# Patient Record
Sex: Male | Born: 2005 | Hispanic: Yes | Marital: Single | Smoking: Never smoker
Health system: Southern US, Community
[De-identification: ages and names within clinical notes are randomized; demographics above are authoritative.]

---
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
## 2023-04-19 ENCOUNTER — Ambulatory Visit (HOSPITAL_COMMUNITY)
Admission: EM | Admit: 2023-04-19 | Discharge: 2023-04-19 | Disposition: A | Discharge status: Home / Self Care | Attending: Emergency Medicine | Admitting: Emergency Medicine

## 2023-04-19 ENCOUNTER — Encounter (HOSPITAL_COMMUNITY): Payer: Self-pay | Admitting: Emergency Medicine

## 2023-04-19 ENCOUNTER — Other Ambulatory Visit: Payer: Self-pay

## 2023-04-19 DIAGNOSIS — U071 COVID-19: Secondary | ICD-10-CM | POA: Diagnosis not present

## 2023-04-19 LAB — POC COVID19/FLU A&B COMBO
Covid Antigen, POC: POSITIVE — AB
Influenza A Antigen, POC: NEGATIVE
Influenza B Antigen, POC: NEGATIVE

## 2023-04-19 MED ORDER — PROMETHAZINE-DM 6.25-15 MG/5ML PO SYRP: 5.0000 mL | ORAL_SOLUTION | Freq: Four times a day (QID) | ORAL | 0 refills | Status: AC | PRN

## 2023-04-19 NOTE — ED Triage Notes (Signed)
 Pt having fever, fatigue and cough for the past 3 days, no taking medication for fever.

## 2023-04-19 NOTE — Discharge Instructions (Signed)
 What is COVID-19?   ??V?D-19 stands for "coronavirus disease 2019." It is caused by a virus called ??RS-?oV-2.   C?V?D-19 mainly spreads from person to person usually when an infected person coughs, sneezes, or talks near other people.  A person can become infected and spread the virus to others, even without having any symptoms.  This one of the reasons COVID-19 spreads so quickly.   Can COVID-19 be prevented?   Yes!  The best way to prevent ??V?D-19 is to get vaccinated. In the [place], people age 23 months and older can get a vaccine.  In addition to vaccination, there are other things you can do to help protect yourself and others. You can:  ?Wash your hands often. ?Consider wearing a face mask in some situations. Masks can help protect both the wearer and others around them. ?Stay home when you are sick. Try to avoid close contact with other people. ?Cover your mouth and nose with the inside of your elbow when you cough or sneeze. ?If someone in your home is sick, regularly clean things that are touched a lot. This includes counters, bedside tables, doorknobs, computers, phones, and bathroom surfaces. ?Make sure that there is good ventilation (air flow) in your home. When possible, open windows to let fresh air in.  Experts recommend "layering" these strategies. This means doing more than 1 of the things above to protect yourself, especially at times when lots of people are sick.  What are the symptoms of COVID-19?   Symptoms usually start 3 to 5 days after a person is infected with the virus but for some people, it can take up to 2 weeks for symptoms to appear. Many infected people only have mild cold symptoms. Some people never show symptoms at all.  When symptoms do happen, they can include:  ? Fever ? Cough ? Trouble breathing ? Feeling tired ? Shaking chills ? Muscle aches ? Headache ? Sore throat ? Runny or stuffy nose ? Problems with sense of smell or taste ? Diarrhea and  vomiting ? Rash or other skin problems  For most people, symptoms get better within a few days to weeks. But a small number of people get very sick and stop being able to breathe on their own. In severe cases, their organs stop working, which can lead to death.  Some people with C?VID-19 continue to have some symptoms for weeks or months. This seems to be more likely in people who are sick enough to need to stay in the hospital. But this can also happen in people who did not get very sick.  What should I do if I get COVID-19?   Stay home, rest, and drink plenty of fluids. You can also take acetaminophen (sample brand name: Tylenol) to relieve fever and aches. If this does not help, you can try medicines like ibuprofen (sample brand names: Advil, Motrin).  If you go to a walk-in clinic or a hospital because of your symptoms, tell someone right away why you are there. The staff might ask you to wear a mask or to wait someplace where you are less likely to spread your infection.  Whether or not you need to see a medical provider, stay home while you are sick with C?V?D-19. Do not go to work or school until your fever has been gone for at least 24 hours without taking medicine such as acetaminophen.  If your breathing symptoms get worse, call your doctor or nurse for advice. If you think  that you are having a medical emergency, call 911 for an ambulance  If I have COVID-19, do I need special treatment?   It depends on your age, health, and symptoms. Most people with mild C?VID-19 can rest at home until they get better. "Mild" means that you might have symptoms like fever, cough, or other cold symptoms, but you do not have trouble breathing. It often takes about 2 weeks for symptoms to improve, but it's not the same for everyone.  Doctors do recommend treatment for people who are at risk for getting seriously ill, even if their symptoms are mild. This includes:  ?Adults 65 years or older ?Adults  who have certain health conditions - Examples include a weaker than normal immune system, diabetes, serious heart or lung disease, chronic kidney disease, and obesity. ?Adults 50 years or older who have not been vaccinated  If you are not sure if you fit into any of these categories, ask your doctor or nurse about treatment. They can talk to you about the risks and benefits.  How is COVID-19 treated?   Conservative care is recommended with rest, staying at home, drinking plenty of clear fluids and taking medications to treat your symptoms.  Am I at risk for getting seriously ill?   It depends on your age, your health, and whether you have been vaccinated. In some people, ??V?D-19 leads to serious problems like ???um?ni?, which can cause a person to not get enough oxygen. It can also lead to heart problems, or even death. This risk gets higher as people get older. It is also higher in people who have other health problems like serious heart disease, chronic kidney disease, type 2 diabetes, chronic obstructive pulmonary disease ("COPD"), sickle cell disease, or obesity. People who have a weak immune system for other reasons (for example, HIV infection or certain medicines), asthma, cystic fibrosis, type 1 diabetes, or high blood pressure might also be at higher risk for serious problems.  Getting vaccinated makes people much less likely to get seriously ill with ??V?D-19.  When should I seek medical attention?   Call your doctor if:  ?You develop new shortness of breath, or your breathing gets worse (but you can still talk in full sentences). ?You become weak or dizzy. ?You have very dark urine or do not urinate for more than 8 hours. ?You have new or worsening symptoms that concern you - ??VID-19 symptoms can include fever, cough, feeling very tired, shaking, chills, headache, and trouble swallowing. They can also include digestive problems like vomiting or diarrhea.  Call 911 for an ambulance  if:  ?You are having so much trouble breathing that you cannot speak a full sentence. ?You are very confused or cannot stay awake. ?Your lips or skin start to turn blue. ?You think that you might be having a medical emergency - Examples include severe chest pain, feeling extremely weak or like you might pass out, or losing control of your body (like being unable to speak normally or move your arm or leg).  Where can I go to learn more?   You can find more information about ?OVID-19 at the following websites: ?[place]  Centers for Disease Control and Prevention ("CDC"): IndexCrawler.co.za ?World Health Organization ("WHO"): AffordableSalon.es

## 2023-04-19 NOTE — ED Provider Notes (Signed)
 [facility]    CSN: [id number] Arrival date & time: 04/19/23  1655    HISTORY   Chief Complaint  Patient presents with   Fever   HPI Phillip Bailey Carlynn Purl is a pleasant, 18 y.o. male who presents to urgent care today. Patient complains of fever, fatigue, body aches and nonproductive cough for the past 3 days.  Patient states the cough has been persistent has been keeping him awake at night, denies shortness of breath, sore throat, loss of taste or smell, known sick contacts, nausea, vomiting, diarrhea.  Patient states he is not trying thing to alleviate his symptoms.  Patient has a temperature of 102.4 on arrival with otherwise normal vital signs.  The history is provided by the patient.   History reviewed. No pertinent past medical history. 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 No pertinent surgical history.  Home Medications    Prior to Admission medications   Medication Sig Start Date End Date Taking? Authorizing Provider  azithromycin (ZITHROMAX Z-PAK) 250 MG tablet Take two table once today and then start one tablet daily for 4 days 10/30/22   Doreene Eland, MD  cetirizine (ZYRTEC ALLERGY) 10 MG tablet Take 1 tablet (10 mg total) by mouth daily. 10/30/22   Doreene Eland, MD  Cholecalciferol (VITAMIN D) 2000 units tablet Take 2,000 Units by mouth daily.    [provider]  ciprofloxacin (CILOXAN) 0.3 % ophthalmic solution Place 1 drop into both eyes every 2 (two) hours while awake. Administer 1 drop, every 2 hours, while awake, for 2 days. Then 1 drop, every 4 hours, while awake, for the next 5 days. 03/15/22   Vallery Sa, Amy L, PA  ibuprofen (ADVIL) 800 MG tablet Take 1 tablet (800 mg total) by mouth every 6 (six) hours  as needed for moderate pain. 08/18/22   Gilda Crease, MD  methocarbamol (ROBAXIN) 500 MG tablet Take 1 tablet (500 mg total) by mouth every 8 (eight) hours as needed for muscle spasms. 08/18/22   Gilda Crease, MD  ondansetron (ZOFRAN-ODT) 4 MG disintegrating tablet 4mg  ODT q6 hours prn nausea/vomit 06/21/22   Blane Ohara, MD  promethazine-dextromethorphan (PROMETHAZINE-DM) 6.25-15 MG/5ML syrup Take 5 mLs by mouth every 8 (eight) hours as needed for cough. 10/30/22   Doreene Eland, MD    Family History Family History  Problem Relation Age of Onset   Hypertension Maternal Grandmother    Social History Social History   Tobacco Use   Smoking status: Never   Smokeless tobacco: Never  Vaping Use   Vaping status: Never Used  Substance Use Topics   Alcohol use: Never   Drug use: Never   Allergies   Patient has no known allergies.  Review of Systems Review of Systems Pertinent findings revealed after performing a 14 point review of systems has been noted in the history of present illness.  Physical Exam Vital Signs BP 107/60 (BP Location: Right Arm)   Pulse 91   Temp (!) 102.4 F (39.1 C) (Oral)   Resp 18   SpO2 96%   No data found.  Physical Exam Vitals and nursing note reviewed.  Constitutional:      General: He is not in acute distress.    Appearance: Normal appearance. He is  not ill-appearing.  HENT:     Head: Normocephalic and atraumatic.     Salivary Glands: Right salivary gland is not diffusely enlarged or tender. Left salivary gland is not diffusely enlarged or tender.     Right Ear: Tympanic membrane, ear canal and external ear normal. No drainage. No middle ear effusion. There is no impacted cerumen. Tympanic membrane is not erythematous or bulging.     Left Ear: Tympanic membrane, ear canal and external ear normal. No drainage.  No middle ear effusion. There is no impacted cerumen. Tympanic membrane is not erythematous or bulging.     Nose:  Nose normal. No nasal deformity, septal deviation, mucosal edema, congestion or rhinorrhea.     Right Turbinates: Not enlarged, swollen or pale.     Left Turbinates: Not enlarged, swollen or pale.     Right Sinus: No maxillary sinus tenderness or frontal sinus tenderness.     Left Sinus: No maxillary sinus tenderness or frontal sinus tenderness.     Mouth/Throat:     Lips: Pink. No lesions.     Mouth: Mucous membranes are moist. No oral lesions.     Pharynx: Oropharynx is clear. Uvula midline. No posterior oropharyngeal erythema or uvula swelling.     Tonsils: No tonsillar exudate. 0 on the right. 0 on the left.  Eyes:     General: Lids are normal.        Right eye: No discharge.        Left eye: No discharge.     Extraocular Movements: Extraocular movements intact.     Conjunctiva/sclera: Conjunctivae normal.     Right eye: Right conjunctiva is not injected.     Left eye: Left conjunctiva is not injected.  Neck:     Trachea: Trachea and phonation normal.  Cardiovascular:     Rate and Rhythm: Normal rate and regular rhythm.     Pulses: Normal pulses.     Heart sounds: Normal heart sounds. No murmur heard.    No friction rub. No gallop.  Pulmonary:     Effort: Pulmonary effort is normal. No accessory muscle usage, prolonged expiration or respiratory distress.     Breath sounds: Normal breath sounds. No stridor, decreased air movement or transmitted upper airway sounds. No decreased breath sounds, wheezing, rhonchi or rales.  Chest:     Chest wall: No tenderness.  Musculoskeletal:        General: Normal range of motion.     Cervical back: Normal range of motion and neck supple. Normal range of motion.  Lymphadenopathy:     Cervical: No cervical adenopathy.  Skin:    General: Skin is warm and dry.     Findings: No erythema or rash.  Neurological:     General: No focal deficit present.     Mental Status: He is alert and oriented to person, place, and time.  Psychiatric:         Mood and Affect: Mood normal.        Behavior: Behavior normal.     Visual Acuity Right Eye Distance:   Left Eye Distance:   Bilateral Distance:    Right Eye Near:   Left Eye Near:    Bilateral Near:     UC Couse / Diagnostics / Procedures:     Radiology No results found.  Procedures Procedures (including critical care time) EKG  Pending results:  Labs Reviewed  POC COVID19/FLU A&B COMBO - Abnormal; Notable for the following components:  Result Value   Covid Antigen, POC Positive (*)    All other components within normal limits    Medications Ordered in UC: Medications - No data to display  UC Diagnoses / Final Clinical Impressions(s)   I have reviewed the triage vital signs and the nursing notes.  Pertinent labs & imaging results that were available during my care of the patient were reviewed by me and considered in my medical decision making (see chart for details).    Final diagnoses:  COVID-19   COVID-19 testing was positive.  Patient provided with Promethazine DM for persistent cough.  Recommend continued ibuprofen, Tylenol as needed for fever and bodyaches.  Patient provided with education regarding isolation and self-care at home.  Conservative care recommended.  Return precautions advised.  Please see discharge instructions below for details of plan of care as provided to patient. ED Prescriptions     Medication Sig Dispense Auth. Provider   promethazine-dextromethorphan (PROMETHAZINE-DM) 6.25-15 MG/5ML syrup Take 5 mLs by mouth 4 (four) times daily as needed for cough. 118 mL Theadora Rama Scales, PA-C      PDMP not reviewed this encounter.  Pending results:  Labs Reviewed  POC COVID19/FLU A&B COMBO - Abnormal; Notable for the following components:      Result Value   Covid Antigen, POC Positive (*)    All other components within normal limits      Discharge Instructions      What is COVID-19?   ??V?D-19 stands for "coronavirus  disease 2019." It is caused by a virus called ??RS-?oV-2.   C?V?D-19 mainly spreads from person to person usually when an infected person coughs, sneezes, or talks near other people.  A person can become infected and spread the virus to others, even without having any symptoms.  This one of the reasons COVID-19 spreads so quickly.   Can COVID-19 be prevented?   Yes!  The best way to prevent ??V?D-19 is to get vaccinated. In the [place], people age 20 months and older can get a vaccine.  In addition to vaccination, there are other things you can do to help protect yourself and others. You can:  ?Wash your hands often. ?Consider wearing a face mask in some situations. Masks can help protect both the wearer and others around them. ?Stay home when you are sick. Try to avoid close contact with other people. ?Cover your mouth and nose with the inside of your elbow when you cough or sneeze. ?If someone in your home is sick, regularly clean things that are touched a lot. This includes counters, bedside tables, doorknobs, computers, phones, and bathroom surfaces. ?Make sure that there is good ventilation (air flow) in your home. When possible, open windows to let fresh air in.  Experts recommend "layering" these strategies. This means doing more than 1 of the things above to protect yourself, especially at times when lots of people are sick.  What are the symptoms of COVID-19?   Symptoms usually start 3 to 5 days after a person is infected with the virus but for some people, it can take up to 2 weeks for symptoms to appear. Many infected people only have mild cold symptoms. Some people never show symptoms at all.  When symptoms do happen, they can include:  ? Fever ? Cough ? Trouble breathing ? Feeling tired ? Shaking chills ? Muscle aches ? Headache ? Sore throat ? Runny or stuffy nose ? Problems with sense of smell or taste ? Diarrhea and vomiting ?  Rash or other skin problems  For most  people, symptoms get better within a few days to weeks. But a small number of people get very sick and stop being able to breathe on their own. In severe cases, their organs stop working, which can lead to death.  Some people with C?VID-19 continue to have some symptoms for weeks or months. This seems to be more likely in people who are sick enough to need to stay in the hospital. But this can also happen in people who did not get very sick.  What should I do if I get COVID-19?   Stay home, rest, and drink plenty of fluids. You can also take acetaminophen (sample brand name: Tylenol) to relieve fever and aches. If this does not help, you can try medicines like ibuprofen (sample brand names: Advil, Motrin).  If you go to a walk-in clinic or a hospital because of your symptoms, tell someone right away why you are there. The staff might ask you to wear a mask or to wait someplace where you are less likely to spread your infection.  Whether or not you need to see a medical provider, stay home while you are sick with C?V?D-19. Do not go to work or school until your fever has been gone for at least 24 hours without taking medicine such as acetaminophen.  If your breathing symptoms get worse, call your doctor or nurse for advice. If you think that you are having a medical emergency, call 911 for an ambulance  If I have COVID-19, do I need special treatment?   It depends on your age, health, and symptoms. Most people with mild C?VID-19 can rest at home until they get better. "Mild" means that you might have symptoms like fever, cough, or other cold symptoms, but you do not have trouble breathing. It often takes about 2 weeks for symptoms to improve, but it's not the same for everyone.  Doctors do recommend treatment for people who are at risk for getting seriously ill, even if their symptoms are mild. This includes:  ?Adults 65 years or older ?Adults who have certain health conditions - Examples include  a weaker than normal immune system, diabetes, serious heart or lung disease, chronic kidney disease, and obesity. ?Adults 50 years or older who have not been vaccinated  If you are not sure if you fit into any of these categories, ask your doctor or nurse about treatment. They can talk to you about the risks and benefits.  How is COVID-19 treated?   Conservative care is recommended with rest, staying at home, drinking plenty of clear fluids and taking medications to treat your symptoms.  Am I at risk for getting seriously ill?   It depends on your age, your health, and whether you have been vaccinated. In some people, ??V?D-19 leads to serious problems like ???um?ni?, which can cause a person to not get enough oxygen. It can also lead to heart problems, or even death. This risk gets higher as people get older. It is also higher in people who have other health problems like serious heart disease, chronic kidney disease, type 2 diabetes, chronic obstructive pulmonary disease ("COPD"), sickle cell disease, or obesity. People who have a weak immune system for other reasons (for example, HIV infection or certain medicines), asthma, cystic fibrosis, type 1 diabetes, or high blood pressure might also be at higher risk for serious problems.  Getting vaccinated makes people much less likely to get seriously ill with ??V?D-19.  When should I seek medical attention?   Call your doctor if:  ?You develop new shortness of breath, or your breathing gets worse (but you can still talk in full sentences). ?You become weak or dizzy. ?You have very dark urine or do not urinate for more than 8 hours. ?You have new or worsening symptoms that concern you - ??VID-19 symptoms can include fever, cough, feeling very tired, shaking, chills, headache, and trouble swallowing. They can also include digestive problems like vomiting or diarrhea.  Call 911 for an ambulance if:  ?You are having so much trouble breathing that  you cannot speak a full sentence. ?You are very confused or cannot stay awake. ?Your lips or skin start to turn blue. ?You think that you might be having a medical emergency - Examples include severe chest pain, feeling extremely weak or like you might pass out, or losing control of your body (like being unable to speak normally or move your arm or leg).  Where can I go to learn more?   You can find more information about ?OVID-19 at the following websites: ?[place] Centers for Disease Control and Prevention ("CDC"): IndexCrawler.co.za ?World Health Organization ("WHO"): [employer]Salon.es       Disposition Upon Discharge:  Condition: stable for discharge home  Patient presented with an acute illness with associated systemic symptoms and significant discomfort requiring urgent management. In my opinion, this is a condition that a prudent lay person (someone who possesses an average knowledge of health and medicine) may potentially expect to result in complications if not addressed urgently such as respiratory distress, impairment of bodily function or dysfunction of bodily organs.   Routine symptom specific, illness specific and/or disease specific instructions were discussed with the patient and/or caregiver at length.   As such, the patient has been evaluated and assessed, work-up was performed and treatment was provided in alignment with urgent care protocols and evidence based medicine.  Patient/parent/caregiver has been advised that the patient may require follow up for further testing and treatment if the symptoms continue in spite of treatment, as clinically indicated and appropriate.  Patient/parent/caregiver has been advised to return to the [facility] or PCP if no better; to PCP or the Emergency Department if new signs and symptoms develop, or if the current signs or symptoms continue to change or worsen for further workup, evaluation and treatment as  clinically indicated and appropriate  The patient will follow up with their current PCP if and as advised. If the patient does not currently have a PCP we will assist them in obtaining one.   The patient may need specialty follow up if the symptoms continue, in spite of conservative treatment and management, for further workup, evaluation, consultation and treatment as clinically indicated and appropriate.  Patient/parent/caregiver verbalized understanding and agreement of plan as discussed.  All questions were addressed during visit.  Please see discharge instructions below for further details of plan.  This office note has been dictated using Teaching laboratory technician.  Unfortunately, this method of dictation can sometimes lead to typographical or grammatical errors.  I apologize for your inconvenience in advance if this occurs.  Please do not hesitate to reach out to me if clarification is needed.      Theadora Rama Scales, [place] 04/19/23 1931
